# Patient Record
Sex: Male | Born: 1945 | Race: Black or African American | Hispanic: No | Marital: Single | Smoking: Current every day smoker
Health system: Southern US, Community
[De-identification: ages and names within clinical notes are randomized; demographics above are authoritative.]

## PROBLEM LIST (undated history)

## (undated) DIAGNOSIS — S52309A Unspecified fracture of shaft of unspecified radius, initial encounter for closed fracture: Secondary | ICD-10-CM

## (undated) DIAGNOSIS — F17200 Nicotine dependence, unspecified, uncomplicated: Secondary | ICD-10-CM

---

## 2005-06-22 ENCOUNTER — Emergency Department (HOSPITAL_COMMUNITY): Admission: EM | Admit: 2005-06-22 | Discharge: 2005-06-22 | Payer: Self-pay | Admitting: Emergency Medicine

## 2005-07-02 ENCOUNTER — Emergency Department (HOSPITAL_COMMUNITY): Admission: EM | Admit: 2005-07-02 | Discharge: 2005-07-02 | Payer: Self-pay | Admitting: Emergency Medicine

## 2005-09-20 ENCOUNTER — Observation Stay (HOSPITAL_COMMUNITY): Admission: EM | Admit: 2005-09-20 | Discharge: 2005-09-20 | Payer: Self-pay | Admitting: Emergency Medicine

## 2005-11-01 HISTORY — PX: LACERATION REPAIR: SHX5168

## 2013-08-12 ENCOUNTER — Emergency Department (INDEPENDENT_AMBULATORY_CARE_PROVIDER_SITE_OTHER)
Admission: EM | Admit: 2013-08-12 | Discharge: 2013-08-12 | Disposition: A | Discharge status: Home / Self Care | Payer: Medicare Other | Source: Home / Self Care | Attending: Family Medicine | Admitting: Family Medicine

## 2013-08-12 ENCOUNTER — Encounter (HOSPITAL_COMMUNITY): Payer: Self-pay | Admitting: Emergency Medicine

## 2013-08-12 ENCOUNTER — Emergency Department (INDEPENDENT_AMBULATORY_CARE_PROVIDER_SITE_OTHER): Payer: Medicare Other

## 2013-08-12 ENCOUNTER — Emergency Department (HOSPITAL_COMMUNITY): Payer: Medicare Other

## 2013-08-12 DIAGNOSIS — S20212A Contusion of left front wall of thorax, initial encounter: Secondary | ICD-10-CM

## 2013-08-12 DIAGNOSIS — S20219A Contusion of unspecified front wall of thorax, initial encounter: Secondary | ICD-10-CM

## 2013-08-12 DIAGNOSIS — IMO0002 Reserved for concepts with insufficient information to code with codable children: Secondary | ICD-10-CM

## 2013-08-12 MED ORDER — TRAMADOL HCL 50 MG PO TABS: 50.0000 mg | ORAL_TABLET | Freq: Four times a day (QID) | ORAL | Status: DC | PRN
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
## 2015-12-21 ENCOUNTER — Emergency Department (INDEPENDENT_AMBULATORY_CARE_PROVIDER_SITE_OTHER): Payer: Medicare Other

## 2015-12-21 ENCOUNTER — Emergency Department (HOSPITAL_COMMUNITY): Payer: Medicare Other

## 2015-12-21 ENCOUNTER — Encounter (HOSPITAL_COMMUNITY): Payer: Self-pay | Admitting: Emergency Medicine

## 2015-12-21 ENCOUNTER — Emergency Department (INDEPENDENT_AMBULATORY_CARE_PROVIDER_SITE_OTHER)
Admission: EM | Admit: 2015-12-21 | Discharge: 2015-12-21 | Disposition: A | Discharge status: Home / Self Care | Payer: Self-pay | Source: Home / Self Care | Attending: Family Medicine | Admitting: Family Medicine

## 2015-12-21 DIAGNOSIS — R0781 Pleurodynia: Secondary | ICD-10-CM

## 2015-12-21 DIAGNOSIS — R0789 Other chest pain: Secondary | ICD-10-CM

## 2015-12-21 DIAGNOSIS — R51 Headache: Secondary | ICD-10-CM

## 2015-12-21 DIAGNOSIS — M542 Cervicalgia: Secondary | ICD-10-CM

## 2015-12-21 DIAGNOSIS — R519 Headache, unspecified: Secondary | ICD-10-CM

## 2015-12-21 MED ORDER — NAPROXEN 500 MG PO TABS: 500.0000 mg | ORAL_TABLET | Freq: Two times a day (BID) | ORAL | Status: DC

## 2015-12-21 NOTE — Discharge Instructions (Signed)
General Headache Without Cause A headache is pain or discomfort felt around the head or neck area. There are many causes and types of headaches. In some cases, the cause may not be found.  HOME CARE  Managing Pain  Take over-the-counter and prescription medicines only as told by your doctor.  Lie down in a dark, quiet room when you have a headache.  If directed, apply ice to the head and neck area:  Put ice in a plastic bag.  Place a towel between your skin and the bag.  Leave the ice on for 20 minutes, 2-3 times per day.  Use a heating pad or hot shower to apply heat to the head and neck area as told by your doctor.  Keep lights dim if bright lights bother you or make your headaches worse. Eating and Drinking  Eat meals on a regular schedule.  Lessen how much alcohol you drink.  Lessen how much caffeine you drink, or stop drinking caffeine. General Instructions  Keep all follow-up visits as told by your doctor. This is important.  Keep a journal to find out if certain things bring on headaches. For example, write down:  What you eat and drink.  How much sleep you get.  Any change to your diet or medicines.  Relax by getting a massage or doing other relaxing activities.  Lessen stress.  Sit up straight. Do not tighten (tense) your muscles.  Do not use tobacco products. This includes cigarettes, chewing tobacco, or e-cigarettes. If you need help quitting, ask your doctor.  Exercise regularly as told by your doctor.  Get enough sleep. This often means 7-9 hours of sleep. GET HELP IF:  Your symptoms are not helped by medicine.  You have a headache that feels different than the other headaches.  You feel sick to your stomach (nauseous) or you throw up (vomit).  You have a fever. GET HELP RIGHT AWAY IF:   Your headache becomes really bad.  You keep throwing up.  You have a stiff neck.  You have trouble seeing.  You have trouble speaking.  You have  pain in the eye or ear.  Your muscles are weak or you lose muscle control.  You lose your balance or have trouble walking.  You feel like you will pass out (faint) or you pass out.  You have confusion.   This information is not intended to replace advice given to you by your health care provider. Make sure you discuss any questions you have with your health care provider.   Document Released: 07/27/2008 Document Revised: 07/09/2015 Document Reviewed: 02/10/2015 Elsevier Interactive Patient Education 2016 [employer] Chest Wall Pain Chest wall pain is pain in or around the bones and muscles of your chest. Sometimes, an injury causes this pain. Sometimes, the cause may not be known. This pain may take several weeks or longer to get better. HOME CARE Pay attention to any changes in your symptoms. Take these actions to help with your pain:  Rest as told by your doctor.  Avoid activities that cause pain. Try not to use your chest, belly (abdominal), or side muscles to lift heavy things.  If directed, apply ice to the painful area:  Put ice in a plastic bag.  Place a towel between your skin and the bag.  Leave the ice on for 20 minutes, 2-3 times per day.  Take over-the-counter and prescription medicines only as told by your doctor.  Do not use tobacco products, including cigarettes,  chewing tobacco, and e-cigarettes. If you need help quitting, ask your doctor.  Keep all follow-up visits as told by your doctor. This is important. GET HELP IF:  You have a fever.  Your chest pain gets worse.  You have new symptoms. GET HELP RIGHT AWAY IF:  You feel sick to your stomach (nauseous) or you throw up (vomit).  You feel sweaty or light-headed.  You have a cough with phlegm (sputum) or you cough up blood.  You are short of breath.   This information is not intended to replace advice given to you by your health care provider. Make sure you discuss any questions you have with  your health care provider.   Document Released: 04/05/2008 Document Revised: 07/09/2015 Document Reviewed: 01/13/2015 Elsevier Interactive Patient Education 2016 [employer] Cervical Sprain A cervical sprain is an injury in the neck in which the strong, fibrous tissues (ligaments) that connect your neck bones stretch or tear. Cervical sprains can range from mild to severe. Severe cervical sprains can cause the neck vertebrae to be unstable. This can lead to damage of the spinal cord and can result in serious nervous system problems. The amount of time it takes for a cervical sprain to get better depends on the cause and extent of the injury. Most cervical sprains heal in 1 to 3 weeks. CAUSES  Severe cervical sprains may be caused by:   Contact sport injuries (such as from football, rugby, wrestling, hockey, auto racing, gymnastics, diving, martial arts, or boxing).   Motor vehicle collisions.   Whiplash injuries. This is an injury from a sudden forward and backward whipping movement of the head and neck.  Falls.  Mild cervical sprains may be caused by:   Being in an awkward position, such as while cradling a telephone between your ear and shoulder.   Sitting in a chair that does not offer proper support.   Working at a poorly Marketing executive station.   Looking up or down for long periods of time.  SYMPTOMS   Pain, soreness, stiffness, or a burning sensation in the front, back, or sides of the neck. This discomfort may develop immediately after the injury or slowly, 24 hours or more after the injury.   Pain or tenderness directly in the middle of the back of the neck.   Shoulder or upper back pain.   Limited ability to move the neck.   Headache.   Dizziness.   Weakness, numbness, or tingling in the hands or arms.   Muscle spasms.   Difficulty swallowing or chewing.   Tenderness and swelling of the neck.  DIAGNOSIS  Most of the time your health care  provider can diagnose a cervical sprain by taking your history and doing a physical exam. Your health care provider will ask about previous neck injuries and any known neck problems, such as arthritis in the neck. X-rays may be taken to find out if there are any other problems, such as with the bones of the neck. Other tests, such as a CT scan or MRI, may also be needed.  TREATMENT  Treatment depends on the severity of the cervical sprain. Mild sprains can be treated with rest, keeping the neck in place (immobilization), and pain medicines. Severe cervical sprains are immediately immobilized. Further treatment is done to help with pain, muscle spasms, and other symptoms and may include:  Medicines, such as pain relievers, numbing medicines, or muscle relaxants.   Physical therapy. This may involve stretching exercises, strengthening exercises,  and posture training. Exercises and improved posture can help stabilize the neck, strengthen muscles, and help stop symptoms from returning.  HOME CARE INSTRUCTIONS   Put ice on the injured area.   Put ice in a plastic bag.   Place a towel between your skin and the bag.   Leave the ice on for 15-20 minutes, 3-4 times a day.   If your injury was severe, you may have been given a cervical collar to wear. A cervical collar is a two-piece collar designed to keep your neck from moving while it heals.  Do not remove the collar unless instructed by your health care provider.  If you have long hair, keep it outside of the collar.  Ask your health care provider before making any adjustments to your collar. Minor adjustments may be required over time to improve comfort and reduce pressure on your chin or on the back of your head.  Ifyou are allowed to remove the collar for cleaning or bathing, follow your health care provider's instructions on how to do so safely.  Keep your collar clean by wiping it with mild soap and water and drying it completely. If  the collar you have been given includes removable pads, remove them every 1-2 days and hand wash them with soap and water. Allow them to air dry. They should be completely dry before you wear them in the collar.  If you are allowed to remove the collar for cleaning and bathing, wash and dry the skin of your neck. Check your skin for irritation or sores. If you see any, tell your health care provider.  Do not drive while wearing the collar.   Only take over-the-counter or prescription medicines for pain, discomfort, or fever as directed by your health care provider.   Keep all follow-up appointments as directed by your health care provider.   Keep all physical therapy appointments as directed by your health care provider.   Make any needed adjustments to your workstation to promote good posture.   Avoid positions and activities that make your symptoms worse.   Warm up and stretch before being active to help prevent problems.  SEEK MEDICAL CARE IF:   Your pain is not controlled with medicine.   You are unable to decrease your pain medicine over time as planned.   Your activity level is not improving as expected.  SEEK IMMEDIATE MEDICAL CARE IF:   You develop any bleeding.  You develop stomach upset.  You have signs of an allergic reaction to your medicine.   Your symptoms get worse.   You develop new, unexplained symptoms.   You have numbness, tingling, weakness, or paralysis in any part of your body.  MAKE SURE YOU:   Understand these instructions.  Will watch your condition.  Will get help right away if you are not doing well or get worse.   This information is not intended to replace advice given to you by your health care provider. Make sure you discuss any questions you have with your health care provider.   Document Released: 08/15/2007 Document Revised: 10/23/2013 Document Reviewed: 04/25/2013 Elsevier Interactive Patient Education [employer].
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
## 2015-12-21 NOTE — ED Notes (Signed)
D/c by frank patrick, PA

## 2017-03-20 ENCOUNTER — Emergency Department (HOSPITAL_COMMUNITY)
Admission: EM | Admit: 2017-03-20 | Discharge: 2017-03-20 | Disposition: A | Discharge status: Home / Self Care | Payer: No Typology Code available for payment source | Attending: Emergency Medicine | Admitting: Emergency Medicine

## 2017-03-20 ENCOUNTER — Encounter (HOSPITAL_COMMUNITY): Payer: Self-pay | Admitting: *Deleted

## 2017-03-20 ENCOUNTER — Encounter (HOSPITAL_COMMUNITY): Payer: Self-pay | Admitting: Emergency Medicine

## 2017-03-20 ENCOUNTER — Ambulatory Visit (HOSPITAL_COMMUNITY)
Admission: EM | Admit: 2017-03-20 | Discharge: 2017-03-20 | Disposition: A | Discharge status: Home / Self Care | Payer: Medicare Other | Attending: Internal Medicine | Admitting: Internal Medicine

## 2017-03-20 DIAGNOSIS — F17200 Nicotine dependence, unspecified, uncomplicated: Secondary | ICD-10-CM | POA: Diagnosis not present

## 2017-03-20 DIAGNOSIS — M545 Low back pain, unspecified: Secondary | ICD-10-CM

## 2017-03-20 DIAGNOSIS — Y939 Activity, unspecified: Secondary | ICD-10-CM | POA: Insufficient documentation

## 2017-03-20 DIAGNOSIS — R51 Headache: Secondary | ICD-10-CM | POA: Diagnosis not present

## 2017-03-20 DIAGNOSIS — Y999 Unspecified external cause status: Secondary | ICD-10-CM | POA: Insufficient documentation

## 2017-03-20 DIAGNOSIS — Y92410 Unspecified street and highway as the place of occurrence of the external cause: Secondary | ICD-10-CM | POA: Insufficient documentation

## 2017-03-20 DIAGNOSIS — G44309 Post-traumatic headache, unspecified, not intractable: Secondary | ICD-10-CM

## 2017-03-20 DIAGNOSIS — R519 Headache, unspecified: Secondary | ICD-10-CM

## 2017-03-20 DIAGNOSIS — M542 Cervicalgia: Secondary | ICD-10-CM | POA: Diagnosis not present

## 2017-03-20 DIAGNOSIS — M62838 Other muscle spasm: Secondary | ICD-10-CM | POA: Diagnosis not present

## 2017-03-20 DIAGNOSIS — S0990XA Unspecified injury of head, initial encounter: Secondary | ICD-10-CM | POA: Diagnosis present

## 2017-03-20 MED ORDER — CYCLOBENZAPRINE HCL 10 MG PO TABS: 5.0000 mg | ORAL_TABLET | Freq: Two times a day (BID) | ORAL | 0 refills | Status: DC | PRN

## 2017-03-20 MED ORDER — CYCLOBENZAPRINE HCL 10 MG PO TABS
5.0000 mg | ORAL_TABLET | Freq: Once | ORAL | Status: AC
  Administered 2017-03-20: 5 mg via ORAL
  Filled 2017-03-20: qty 1

## 2017-03-20 MED ORDER — IBUPROFEN 800 MG PO TABS
800.0000 mg | ORAL_TABLET | Freq: Once | ORAL | Status: AC
  Administered 2017-03-20: 800 mg via ORAL
  Filled 2017-03-20: qty 1
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
## 2017-03-20 NOTE — ED Provider Notes (Signed)
I have personally seen and examined the patient. I have reviewed the documentation on PMH/FH/Soc Hx. I have discussed the plan of care with the resident and patient.  I have reviewed and agree with the resident's documentation. Please see associated encounter note.   EKG Interpretation None         [name], [name], MD 03/20/17 2221

## 2017-03-20 NOTE — Discharge Instructions (Signed)
For current complaints recommend being transferred to the emergency room for further evaluation and imaging studies.
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
## 2017-03-20 NOTE — ED Notes (Signed)
ED Provider at bedside. 
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
## 2017-03-20 NOTE — ED Notes (Signed)
Pt states he will call a ride to pick him up. Pt ambulatory at DC. NAD. Verbalizes medication teaching and follow up care at [facility] if needed.
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
## 2017-03-24 ENCOUNTER — Ambulatory Visit (HOSPITAL_COMMUNITY)
Admission: EM | Admit: 2017-03-24 | Discharge: 2017-03-24 | Disposition: A | Discharge status: Home / Self Care | Payer: Medicare Other | Attending: Family Medicine | Admitting: Family Medicine

## 2017-03-24 ENCOUNTER — Encounter (HOSPITAL_COMMUNITY): Payer: Self-pay | Admitting: Family Medicine

## 2017-03-24 DIAGNOSIS — M542 Cervicalgia: Secondary | ICD-10-CM

## 2017-03-24 DIAGNOSIS — R079 Chest pain, unspecified: Secondary | ICD-10-CM | POA: Diagnosis not present

## 2017-03-24 MED ORDER — PREDNISONE 20 MG PO TABS: ORAL_TABLET | ORAL | 0 refills | Status: DC
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
## 2017-03-24 NOTE — Discharge Instructions (Signed)
Please return if you're not feeling better by next Monday.
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
## 2017-04-24 ENCOUNTER — Emergency Department (HOSPITAL_COMMUNITY): Payer: Medicare Other

## 2017-04-24 ENCOUNTER — Encounter (HOSPITAL_COMMUNITY): Payer: Self-pay

## 2017-04-24 ENCOUNTER — Emergency Department (HOSPITAL_COMMUNITY)
Admission: EM | Admit: 2017-04-24 | Discharge: 2017-04-24 | Disposition: A | Discharge status: Home / Self Care | Payer: Medicare Other | Attending: Emergency Medicine | Admitting: Emergency Medicine

## 2017-04-24 DIAGNOSIS — Y939 Activity, unspecified: Secondary | ICD-10-CM | POA: Insufficient documentation

## 2017-04-24 DIAGNOSIS — W010XXA Fall on same level from slipping, tripping and stumbling without subsequent striking against object, initial encounter: Secondary | ICD-10-CM | POA: Diagnosis not present

## 2017-04-24 DIAGNOSIS — S52322A Displaced transverse fracture of shaft of left radius, initial encounter for closed fracture: Secondary | ICD-10-CM | POA: Insufficient documentation

## 2017-04-24 DIAGNOSIS — Y929 Unspecified place or not applicable: Secondary | ICD-10-CM | POA: Insufficient documentation

## 2017-04-24 DIAGNOSIS — Y998 Other external cause status: Secondary | ICD-10-CM | POA: Diagnosis not present

## 2017-04-24 DIAGNOSIS — S59912A Unspecified injury of left forearm, initial encounter: Secondary | ICD-10-CM | POA: Diagnosis present

## 2017-04-24 DIAGNOSIS — S52322B Displaced transverse fracture of shaft of left radius, initial encounter for open fracture type I or II: Secondary | ICD-10-CM | POA: Insufficient documentation

## 2017-04-24 MED ORDER — FENTANYL CITRATE (PF) 100 MCG/2ML IJ SOLN
50.0000 ug | Freq: Once | INTRAMUSCULAR | Status: AC
  Administered 2017-04-24: 50 ug via INTRAVENOUS
  Filled 2017-04-24: qty 2

## 2017-04-24 MED ORDER — IBUPROFEN 400 MG PO TABS: 400.0000 mg | ORAL_TABLET | Freq: Four times a day (QID) | ORAL | 0 refills | Status: DC | PRN

## 2017-04-24 MED ORDER — ACETAMINOPHEN 500 MG PO TABS: 1000.0000 mg | ORAL_TABLET | Freq: Four times a day (QID) | ORAL | 0 refills | Status: DC | PRN

## 2017-04-24 MED ORDER — CEPHALEXIN 500 MG PO CAPS: 500.0000 mg | ORAL_CAPSULE | Freq: Three times a day (TID) | ORAL | 0 refills | Status: AC

## 2017-04-24 MED ORDER — CEFAZOLIN SODIUM-DEXTROSE 2-4 GM/100ML-% IV SOLN
2.0000 g | Freq: Once | INTRAVENOUS | Status: AC
  Administered 2017-04-24: 2 g via INTRAVENOUS
  Filled 2017-04-24: qty 100

## 2017-04-24 MED ORDER — OXYCODONE HCL 5 MG PO TABS: 5.0000 mg | ORAL_TABLET | ORAL | 0 refills | Status: AC | PRN

## 2017-04-24 MED ORDER — TETANUS-DIPHTH-ACELL PERTUSSIS 5-2.5-18.5 LF-MCG/0.5 IM SUSP
0.5000 mL | Freq: Once | INTRAMUSCULAR | Status: AC
  Administered 2017-04-24: 0.5 mL via INTRAMUSCULAR
  Filled 2017-04-24: qty 0.5
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
## 2017-04-24 NOTE — Progress Notes (Signed)
Orthopedic Tech Progress Note Patient Details:  Garrett ShihWilliam Patterson September 13, 1946 [id number]  Ortho Devices Type of Ortho Device: Ace wrap, Arm sling, Sugartong splint Ortho Device/Splint Location: lue Ortho Device/Splint Interventions: Application   Brandice Busser 04/24/2017, 1:58 PM

## 2017-04-24 NOTE — ED Notes (Addendum)
Garrett Standardamela Stempson- Daughter- (h) [phone] (c) [phone] Call her when pt is discharged

## 2017-04-24 NOTE — ED Notes (Signed)
ED Provider at bedside. 

## 2017-04-24 NOTE — Consult Note (Signed)
Reason for Consult: Left radius shaft fracture Referring Physician: Dalene SeltzerSchlossman MD (EDP)  Garrett Patterson is an 71 y.o. male.  HPI:   71 yo male who reports a mechanical fall at his home last evening landing on his outstretched left arm.  He does admit drinking "a little".  After continued left forearm pain today and some bleeding, he walked to get food and groceries, ate, then came to the ED for further evaluation and treatment.  He was noted to have a left arm deformity with pain and a small area of dried blood on the dorsal aspect that could represent a punctate opening.  He reports left arm pain and denies other injuries.  He denies numbness in his left hand.  He has received ancef and tetanus.  History reviewed. No pertinent past medical history.  History reviewed. No pertinent surgical history.  No family history on file.  Social History:  reports that he has been smoking.  He has never used smokeless tobacco. He reports that he drinks alcohol. He reports that he does not use drugs.  Allergies: No Known Allergies  Medications: I have reviewed the patient's current medications.  No results found for this or any previous visit (from the past 48 hour(s)).  Dg Forearm Left  Result Date: 04/24/2017 CLINICAL DATA:  Fall down stairs last night; Painful left forearm with obvious deformity EXAM: LEFT FOREARM - 2 VIEW COMPARISON:  None. FINDINGS: Horizontal fracture through the mid shaft radius with 1 shaft width ulnar displacement of the distal fracture fragment and 1 cm override. Elbow joint and radiocarpal joint are intact. IMPRESSION: Horizontal fracture through the midshaft radius with displacement and override. Electronically Signed   By: Genevive BiStewart  Edmunds M.D.   On: 04/24/2017 12:25    ROS Blood pressure (!) 151/73, pulse 74, temperature 98.5 F (36.9 C), resp. rate 12, SpO2 100 %. Physical Exam  Constitutional: He is oriented to person, place, and time. He appears well-developed and  well-nourished.  HENT:  Head: Normocephalic and atraumatic.  Eyes: Pupils are equal, round, and reactive to light.  Neck: Normal range of motion.  Cardiovascular: Normal rate.   Respiratory: Effort normal.  GI: Soft.  Musculoskeletal:       Left forearm: He exhibits tenderness, bony tenderness, swelling and deformity.       Arms: Neurological: He is alert and oriented to person, place, and time.  Psychiatric: He has a normal mood and affect.   He is able to move his left fingers and thumb and his hand is well perfused. His left forearm compartments are soft. His hand shows normal sensation  X-rays of his left forearm independently reviewed show a left radial shaft fracture.  The ulna appears intact and the elbow joint and the DRUJ do not show disruption.   Assessment/Plan: Left radius shaft fracture  1)  Since he has eaten and this is non-emergent, I will have him placed in a splint and will plan for surgery this week.  I probed the dorsal wound and it is minimal.  Will send him home on oral Keflex and pain meds.  I have his contact information for setting him up for surgery as an outpatient.  All questions were encouraged and anwsered.  Garrett Patterson 04/24/2017, 1:44 PM

## 2017-04-24 NOTE — Progress Notes (Signed)
Orthopedic Tech Progress Note Patient Details:  Garrett Patterson June 02, 1946 [id number]  Patient ID: Garrett Patterson, male   DOB: June 02, 1946, 71 y.o.   MRN: [id number]   Nikki DomCrawford, Shmuel Girgis 04/24/2017, 1:59 PM As ordered by Dr. Magnus IvanBlackman

## 2017-04-24 NOTE — ED Triage Notes (Addendum)
Patient fell last night trying to catch himself with left arm. Now swelling with small puncture noted to same.Marland Kitchen. Positive distal pulses, possible deformity to forearm.

## 2017-04-24 NOTE — ED Notes (Signed)
Dr. Blackman at bedside. 

## 2017-04-25 ENCOUNTER — Encounter (HOSPITAL_BASED_OUTPATIENT_CLINIC_OR_DEPARTMENT_OTHER): Payer: Self-pay | Admitting: *Deleted

## 2017-04-25 ENCOUNTER — Other Ambulatory Visit (INDEPENDENT_AMBULATORY_CARE_PROVIDER_SITE_OTHER): Payer: Self-pay | Admitting: Orthopaedic Surgery

## 2017-04-25 DIAGNOSIS — S52302A Unspecified fracture of shaft of left radius, initial encounter for closed fracture: Secondary | ICD-10-CM

## 2017-04-27 ENCOUNTER — Ambulatory Visit (HOSPITAL_BASED_OUTPATIENT_CLINIC_OR_DEPARTMENT_OTHER): Payer: Medicare Other | Admitting: Certified Registered"

## 2017-04-27 ENCOUNTER — Encounter (HOSPITAL_BASED_OUTPATIENT_CLINIC_OR_DEPARTMENT_OTHER): Payer: Self-pay

## 2017-04-27 ENCOUNTER — Ambulatory Visit (HOSPITAL_BASED_OUTPATIENT_CLINIC_OR_DEPARTMENT_OTHER)
Admission: RE | Admit: 2017-04-27 | Discharge: 2017-04-27 | Disposition: A | Discharge status: Home / Self Care | Payer: Medicare Other | Source: Ambulatory Visit | Attending: Orthopaedic Surgery | Admitting: Orthopaedic Surgery

## 2017-04-27 ENCOUNTER — Encounter (HOSPITAL_BASED_OUTPATIENT_CLINIC_OR_DEPARTMENT_OTHER)
Admission: RE | Disposition: A | Discharge status: Home / Self Care | Payer: Self-pay | Source: Ambulatory Visit | Attending: Orthopaedic Surgery

## 2017-04-27 DIAGNOSIS — S52302A Unspecified fracture of shaft of left radius, initial encounter for closed fracture: Secondary | ICD-10-CM | POA: Diagnosis present

## 2017-04-27 DIAGNOSIS — S52322A Displaced transverse fracture of shaft of left radius, initial encounter for closed fracture: Secondary | ICD-10-CM

## 2017-04-27 DIAGNOSIS — Z9889 Other specified postprocedural states: Secondary | ICD-10-CM | POA: Diagnosis not present

## 2017-04-27 DIAGNOSIS — F17200 Nicotine dependence, unspecified, uncomplicated: Secondary | ICD-10-CM | POA: Insufficient documentation

## 2017-04-27 DIAGNOSIS — X58XXXA Exposure to other specified factors, initial encounter: Secondary | ICD-10-CM | POA: Diagnosis not present

## 2017-04-27 HISTORY — PX: OPEN REDUCTION INTERNAL FIXATION (ORIF) DISTAL RADIAL FRACTURE: SHX5989

## 2017-04-27 HISTORY — DX: Unspecified fracture of shaft of unspecified radius, initial encounter for closed fracture: S52.309A

## 2017-04-27 HISTORY — DX: Nicotine dependence, unspecified, uncomplicated: F17.200

## 2017-04-27 SURGERY — OPEN REDUCTION INTERNAL FIXATION (ORIF) DISTAL RADIUS FRACTURE
Anesthesia: General | Site: Arm Lower | Laterality: Left

## 2017-04-27 MED ORDER — SCOPOLAMINE 1 MG/3DAYS TD PT72: 1.0000 | MEDICATED_PATCH | Freq: Once | TRANSDERMAL | Status: DC | PRN

## 2017-04-27 MED ORDER — SENNOSIDES-DOCUSATE SODIUM 8.6-50 MG PO TABS: 1.0000 | ORAL_TABLET | Freq: Every evening | ORAL | 1 refills | Status: AC | PRN

## 2017-04-27 MED ORDER — PROPOFOL 10 MG/ML IV BOLUS
INTRAVENOUS | Status: DC | PRN
  Administered 2017-04-27: 100 mg via INTRAVENOUS

## 2017-04-27 MED ORDER — PROMETHAZINE HCL 25 MG PO TABS: 25.0000 mg | ORAL_TABLET | Freq: Four times a day (QID) | ORAL | 1 refills | Status: AC | PRN

## 2017-04-27 MED ORDER — EPHEDRINE SULFATE 50 MG/ML IJ SOLN
INTRAMUSCULAR | Status: DC | PRN
  Administered 2017-04-27 (×2): 10 mg via INTRAVENOUS

## 2017-04-27 MED ORDER — LIDOCAINE HCL (CARDIAC) 20 MG/ML IV SOLN
INTRAVENOUS | Status: DC | PRN
  Administered 2017-04-27: 30 mg via INTRAVENOUS

## 2017-04-27 MED ORDER — MIDAZOLAM HCL 2 MG/2ML IJ SOLN
INTRAMUSCULAR | Status: AC
  Filled 2017-04-27: qty 2

## 2017-04-27 MED ORDER — LACTATED RINGERS IV SOLN
INTRAVENOUS | Status: DC
  Administered 2017-04-27 (×2): via INTRAVENOUS

## 2017-04-27 MED ORDER — METHOCARBAMOL 500 MG PO TABS: 500.0000 mg | ORAL_TABLET | Freq: Four times a day (QID) | ORAL | 2 refills | Status: AC | PRN

## 2017-04-27 MED ORDER — CEFAZOLIN SODIUM-DEXTROSE 2-4 GM/100ML-% IV SOLN
INTRAVENOUS | Status: AC
  Filled 2017-04-27: qty 100

## 2017-04-27 MED ORDER — DEXAMETHASONE SODIUM PHOSPHATE 10 MG/ML IJ SOLN
INTRAMUSCULAR | Status: DC | PRN
  Administered 2017-04-27: 10 mg via INTRAVENOUS

## 2017-04-27 MED ORDER — FENTANYL CITRATE (PF) 100 MCG/2ML IJ SOLN
INTRAMUSCULAR | Status: AC
  Filled 2017-04-27: qty 2

## 2017-04-27 MED ORDER — OXYCODONE HCL ER 10 MG PO T12A: 10.0000 mg | EXTENDED_RELEASE_TABLET | Freq: Two times a day (BID) | ORAL | 0 refills | Status: AC

## 2017-04-27 MED ORDER — ONDANSETRON HCL 4 MG/2ML IJ SOLN: 4.0000 mg | Freq: Once | INTRAMUSCULAR | Status: DC | PRN

## 2017-04-27 MED ORDER — CEFAZOLIN SODIUM-DEXTROSE 2-4 GM/100ML-% IV SOLN
2.0000 g | INTRAVENOUS | Status: AC
  Administered 2017-04-27: 2 g via INTRAVENOUS

## 2017-04-27 MED ORDER — ONDANSETRON HCL 4 MG PO TABS: 4.0000 mg | ORAL_TABLET | Freq: Three times a day (TID) | ORAL | 0 refills | Status: AC | PRN

## 2017-04-27 MED ORDER — OXYCODONE HCL 5 MG PO TABS: 5.0000 mg | ORAL_TABLET | ORAL | 0 refills | Status: AC | PRN

## 2017-04-27 MED ORDER — MIDAZOLAM HCL 2 MG/2ML IJ SOLN
1.0000 mg | INTRAMUSCULAR | Status: DC | PRN
  Administered 2017-04-27: 1 mg via INTRAVENOUS

## 2017-04-27 MED ORDER — BUPIVACAINE-EPINEPHRINE (PF) 0.5% -1:200000 IJ SOLN
INTRAMUSCULAR | Status: DC | PRN
  Administered 2017-04-27: 20 mL via PERINEURAL

## 2017-04-27 MED ORDER — FENTANYL CITRATE (PF) 100 MCG/2ML IJ SOLN
50.0000 ug | INTRAMUSCULAR | Status: DC | PRN
  Administered 2017-04-27: 50 ug via INTRAVENOUS

## 2017-04-27 MED ORDER — FENTANYL CITRATE (PF) 100 MCG/2ML IJ SOLN: 25.0000 ug | INTRAMUSCULAR | Status: DC | PRN

## 2017-04-27 SURGICAL SUPPLY — 68 items
BANDAGE ACE 3X5.8 VEL STRL LF (GAUZE/BANDAGES/DRESSINGS) ×3 IMPLANT
BANDAGE ACE 4X5 VEL STRL LF (GAUZE/BANDAGES/DRESSINGS) ×2 IMPLANT
BIT DRILL 2.6 (BIT) ×2 IMPLANT
BLADE MINI RND TIP GREEN BEAV (BLADE) IMPLANT
BLADE SURG 15 STRL LF DISP TIS (BLADE) ×2 IMPLANT
BLADE SURG 15 STRL SS (BLADE) ×6
BNDG CMPR 9X4 STRL LF SNTH (GAUZE/BANDAGES/DRESSINGS) ×1
BNDG ESMARK 4X9 LF (GAUZE/BANDAGES/DRESSINGS) ×3 IMPLANT
BRUSH SCRUB EZ PLAIN DRY (MISCELLANEOUS) ×3 IMPLANT
CORDS BIPOLAR (ELECTRODE) ×3 IMPLANT
COVER BACK TABLE 60X90IN (DRAPES) ×3 IMPLANT
COVER MAYO STAND STRL (DRAPES) ×3 IMPLANT
CUFF TOURNIQUET SINGLE 18IN (TOURNIQUET CUFF) IMPLANT
DECANTER SPIKE VIAL GLASS SM (MISCELLANEOUS) IMPLANT
DRAPE EXTREMITY T 121X128X90 (DRAPE) ×3 IMPLANT
DRAPE OEC MINIVIEW 54X84 (DRAPES) ×3 IMPLANT
DRAPE SURG 17X23 STRL (DRAPES) ×3 IMPLANT
DRILL 2.6X220MM LONG AO (BIT) ×2 IMPLANT
GAUZE SPONGE 4X4 12PLY STRL (GAUZE/BANDAGES/DRESSINGS) ×3 IMPLANT
GAUZE SPONGE 4X4 16PLY XRAY LF (GAUZE/BANDAGES/DRESSINGS) IMPLANT
GAUZE XEROFORM 1X8 LF (GAUZE/BANDAGES/DRESSINGS) ×3 IMPLANT
GLOVE BIO SURGEON STRL SZ 6.5 (GLOVE) ×1 IMPLANT
GLOVE BIO SURGEONS STRL SZ 6.5 (GLOVE) ×1
GLOVE BIOGEL PI IND STRL 7.0 (GLOVE) IMPLANT
GLOVE BIOGEL PI INDICATOR 7.0 (GLOVE) ×4
GLOVE SKINSENSE NS SZ7.5 (GLOVE) ×2
GLOVE SKINSENSE STRL SZ7.5 (GLOVE) ×1 IMPLANT
GLOVE SURG SYN 7.5  E (GLOVE) ×2
GLOVE SURG SYN 7.5 E (GLOVE) ×1 IMPLANT
GLOVE SURG SYN 7.5 PF PI (GLOVE) ×1 IMPLANT
GOWN STRL REIN XL XLG (GOWN DISPOSABLE) ×3 IMPLANT
GOWN STRL REUS W/ TWL LRG LVL3 (GOWN DISPOSABLE) ×1 IMPLANT
GOWN STRL REUS W/TWL LRG LVL3 (GOWN DISPOSABLE) ×3
NEEDLE HYPO 22GX1.5 SAFETY (NEEDLE) IMPLANT
NS IRRIG 1000ML POUR BTL (IV SOLUTION) ×3 IMPLANT
PACK BASIN DAY SURGERY FS (CUSTOM PROCEDURE TRAY) ×3 IMPLANT
PAD CAST 3X4 CTTN HI CHSV (CAST SUPPLIES) ×1 IMPLANT
PADDING CAST ABS 3INX4YD NS (CAST SUPPLIES)
PADDING CAST ABS COTTON 3X4 (CAST SUPPLIES) IMPLANT
PADDING CAST COTTON 3X4 STRL (CAST SUPPLIES) ×3
PADDING CAST SYNTHETIC 3 NS LF (CAST SUPPLIES) ×2
PADDING CAST SYNTHETIC 3X4 NS (CAST SUPPLIES) IMPLANT
PADDING CAST SYNTHETIC 4 (CAST SUPPLIES) ×2
PADDING CAST SYNTHETIC 4X4 STR (CAST SUPPLIES) IMPLANT
PLATE COMPRESSION 7H STR (Plate) ×2 IMPLANT
RUBBERBAND STERILE (MISCELLANEOUS) ×3 IMPLANT
SCREW BONE 3.5X14MM (Screw) ×2 IMPLANT
SCREW BONE 3.5X16MM (Screw) ×10 IMPLANT
SLEEVE SCD COMPRESS KNEE MED (MISCELLANEOUS) ×3 IMPLANT
SPLINT FIBERGLASS 3X35 (CAST SUPPLIES) IMPLANT
SPONGE LAP 18X18 X RAY DECT (DISPOSABLE) ×3 IMPLANT
STOCKINETTE 4X48 STRL (DRAPES) ×3 IMPLANT
SUCTION FRAZIER HANDLE 10FR (MISCELLANEOUS) ×2
SUCTION TUBE FRAZIER 10FR DISP (MISCELLANEOUS) ×1 IMPLANT
SUT ETHILON 4 0 PS 2 18 (SUTURE) ×3 IMPLANT
SUT VIC AB 2-0 SH 27 (SUTURE)
SUT VIC AB 2-0 SH 27XBRD (SUTURE) IMPLANT
SUT VIC AB 3-0 PS1 18 (SUTURE) ×3
SUT VIC AB 3-0 PS1 18XBRD (SUTURE) ×1 IMPLANT
SYR BULB 3OZ (MISCELLANEOUS) ×3 IMPLANT
SYR CONTROL 10ML LL (SYRINGE) IMPLANT
TOWEL OR 17X24 6PK STRL BLUE (TOWEL DISPOSABLE) ×6 IMPLANT
TOWEL OR NON WOVEN STRL DISP B (DISPOSABLE) ×3 IMPLANT
TRAY DSU PREP LF (CUSTOM PROCEDURE TRAY) ×3 IMPLANT
TUBE CONNECTING 20'X1/4 (TUBING) ×1
TUBE CONNECTING 20X1/4 (TUBING) ×2 IMPLANT
UNDERPAD 30X30 (UNDERPADS AND DIAPERS) ×3 IMPLANT
YANKAUER SUCT BULB TIP NO VENT (SUCTIONS) IMPLANT

## 2017-04-27 NOTE — Progress Notes (Signed)
Assisted Dr. Turk with left, ultrasound guided, supraclavicular block. Side rails up, monitors on throughout procedure. See vital signs in flow sheet. Tolerated Procedure well. 

## 2017-04-27 NOTE — Anesthesia Preprocedure Evaluation (Addendum)
Anesthesia Evaluation  Patient identified by MRN, date of birth, ID band Patient awake    Reviewed: Allergy & Precautions, NPO status , Patient's Chart, lab work & pertinent test results  Airway Mallampati: II  TM Distance: >3 FB Neck ROM: Full    Dental  (+) Dental Advisory Given, Lower Dentures, Upper Dentures   Pulmonary Current Smoker,    Pulmonary exam normal breath sounds clear to auscultation       Cardiovascular Exercise Tolerance: Good negative cardio ROS Normal cardiovascular exam Rhythm:Regular Rate:Normal     Neuro/Psych negative neurological ROS  negative psych ROS   GI/Hepatic negative GI ROS, Neg liver ROS, (+)     substance abuse  cocaine use,   Endo/Other  negative endocrine ROS  Renal/GU negative Renal ROS     Musculoskeletal Left radial fracture    Abdominal   Peds  Hematology negative hematology ROS (+)   Anesthesia Other Findings Day of surgery medications reviewed with the patient.  Reproductive/Obstetrics                            Anesthesia Physical Anesthesia Plan  ASA: II  Anesthesia Plan: General   Post-op Pain Management:  Regional for Post-op pain   Induction: Intravenous  PONV Risk Score and Plan: 1 and Ondansetron and Dexamethasone  Airway Management Planned: LMA  Additional Equipment:   Intra-op Plan:   Post-operative Plan: Extubation in OR  Informed Consent: I have reviewed the patients History and Physical, chart, labs and discussed the procedure including the risks, benefits and alternatives for the proposed anesthesia with the patient or authorized representative who has indicated his/her understanding and acceptance.   Dental advisory given  Plan Discussed with: CRNA  Anesthesia Plan Comments: (Risks/benefits of general anesthesia discussed with patient including risk of damage to teeth, lips, gum, and tongue, nausea/vomiting,  allergic reactions to medications, and the possibility of heart attack, stroke and death.  All patient questions answered.  Patient wishes to proceed.)        Anesthesia Quick Evaluation

## 2017-04-27 NOTE — Discharge Instructions (Signed)
Postoperative instructions:  Weightbearing instructions: non weight bearing.  May move elbow, shoulder, wrist and fingers as tolerated  Dressing instructions: Keep your dressing and/or splint clean and dry at all times.  It will be removed at your first post-operative appointment.  Your stitches and/or staples will be removed at this visit.  Incision instructions:  Do not soak your incision for 3 weeks after surgery.  If the incision gets wet, pat dry and do not scrub the incision.  Pain control:  You have been given a prescription to be taken as directed for post-operative pain control.  In addition, elevate the operative extremity above the heart at all times to prevent swelling and throbbing pain.  Take over-the-counter Colace, 100mg  by mouth twice a day while taking narcotic pain medications to help prevent constipation.  Follow up appointments: 1) 10-14 days for suture removal and wound check. 2) Dr. Roda Shutters as scheduled.   -------------------------------------------------------------------------------------------------------------  After Surgery Pain Control:  After your surgery, post-surgical discomfort or pain is likely. This discomfort can last several days to a few weeks. At certain times of the day your discomfort may be more intense.  Did you receive a nerve block?  A nerve block can provide pain relief for one hour to two days after your surgery. As long as the nerve block is working, you will experience little or no sensation in the area the surgeon operated on.  As the nerve block wears off, you will begin to experience pain or discomfort. It is very important that you begin taking your prescribed pain medication before the nerve block fully wears off. Treating your pain at the first sign of the block wearing off will ensure your pain is better controlled and more tolerable when full-sensation returns. Do not wait until the pain is intolerable, as the medicine will be less effective.  It is better to treat pain in advance than to try and catch up.  General Anesthesia:  If you did not receive a nerve block during your surgery, you will need to start taking your pain medication shortly after your surgery and should continue to do so as prescribed by your surgeon.  Pain Medication:  Most commonly we prescribe Vicodin and Percocet for post-operative pain. Both of these medications contain a combination of acetaminophen (Tylenol) and a narcotic to help control pain.   It takes between 30 and 45 minutes before pain medication starts to work. It is important to take your medication before your pain level gets too intense.   Nausea is a common side effect of many pain medications. You will want to eat something before taking your pain medicine to help prevent nausea.   If you are taking a prescription pain medication that contains acetaminophen, we recommend that you do not take additional over the counter acetaminophen (Tylenol).  Other pain relieving options:   Using a cold pack to ice the affected area a few times a day (15 to 20 minutes at a time) can help to relieve pain, reduce swelling and bruising.   Elevation of the affected area can also help to reduce pain and swelling.     Post Anesthesia Home Care Instructions  Activity: Get plenty of rest for the remainder of the day. A responsible individual must stay with you for 24 hours following the procedure.  For the next 24 hours, DO NOT: -Drive a car -Advertising copywriter -Drink alcoholic beverages -Take any medication unless instructed by your physician -Make any legal decisions or sign important  papers.  Meals: Start with liquid foods such as gelatin or soup. Progress to regular foods as tolerated. Avoid greasy, spicy, heavy foods. If nausea and/or vomiting occur, drink only clear liquids until the nausea and/or vomiting subsides. Call your physician if vomiting continues.  Special Instructions/Symptoms: Your  throat may feel dry or sore from the anesthesia or the breathing tube placed in your throat during surgery. If this causes discomfort, gargle with warm salt water. The discomfort should disappear within 24 hours.  If you had a scopolamine patch placed behind your ear for the management of post- operative nausea and/or vomiting:  1. The medication in the patch is effective for 72 hours, after which it should be removed.  Wrap patch in a tissue and discard in the trash. Wash hands thoroughly with soap and water. 2. You may remove the patch earlier than 72 hours if you experience unpleasant side effects which may include dry mouth, dizziness or visual disturbances. 3. Avoid touching the patch. Wash your hands with soap and water after contact with the patch.   Regional Anesthesia Blocks  1. Numbness or the inability to move the "blocked" extremity may last from 3-48 hours after placement. The length of time depends on the medication injected and your individual response to the medication. If the numbness is not going away after 48 hours, call your surgeon.  2. The extremity that is blocked will need to be protected until the numbness is gone and the  Strength has returned. Because you cannot feel it, you will need to take extra care to avoid injury. Because it may be weak, you may have difficulty moving it or using it. You may not know what position it is in without looking at it while the block is in effect.  3. For blocks in the legs and feet, returning to weight bearing and walking needs to be done carefully. You will need to wait until the numbness is entirely gone and the strength has returned. You should be able to move your leg and foot normally before you try and bear weight or walk. You will need someone to be with you when you first try to ensure you do not fall and possibly risk injury.  4. Bruising and tenderness at the needle site are common side effects and will resolve in a few  days.  5. Persistent numbness or new problems with movement should be communicated to the surgeon or the [facility] [place] [phone])/ [facility] [phone]).

## 2017-04-27 NOTE — Anesthesia Procedure Notes (Signed)
Anesthesia Regional Block: Supraclavicular block   Pre-Anesthetic Checklist: ,, timeout performed, Correct Patient, Correct Site, Correct Laterality, Correct Procedure, Correct Position, site marked, Risks and benefits discussed,  Surgical consent,  Pre-op evaluation,  At surgeon's request and post-op pain management  Laterality: Left  Prep: chloraprep       Needles:  Injection technique: Single-shot  Needle Type: Echogenic Needle     Needle Length: 9cm  Needle Gauge: 21     Additional Needles:   Procedures: ultrasound guided,,,,,,,,  Narrative:  Start time: 04/27/2017 9:48 AM End time: 04/27/2017 9:53 AM Injection made incrementally with aspirations every 5 mL.  Performed by: Personally  Anesthesiologist: Cecile HearingURK, Xzayvier Fagin EDWARD  Additional Notes: No pain on injection. No increased resistance to injection. Injection made in 5cc increments.  Good needle visualization.  Patient tolerated procedure well.

## 2017-04-27 NOTE — Op Note (Signed)
   Date of Surgery: 04/27/2017  INDICATIONS: Mr. Garrett Patterson is a 71 y.o.-year-old male who sustained a left radial shaft fracture; he was indicated for open reduction and internal fixation due to the displaced nature of the fracture and came to the operating room today for this procedure. The patient did consent to the procedure after discussion of the risks and benefits.   PREOPERATIVE DIAGNOSIS: left forearm fracture (radius only).  POSTOPERATIVE DIAGNOSIS: Same.  PROCEDURE: Open reduction and internal fixation left radial shaft  CPT:  25515 radius  SURGEON: N. Glee ArvinMichael Jaydien Panepinto, M.D.  ASSIST: none M.D.  ANESTHESIA: general  IV FLUIDS AND URINE: See anesthesia.  ESTIMATED BLOOD LOSS: minimal mL.  IMPLANTS: Stryker small fragment plate and 3.5 mm cortical screws  DRAINS: None.  COMPLICATIONS: None.  DESCRIPTION OF PROCEDURE: The patient was brought to the operating room and placed supine on the operating table.  The patient had been signed prior to the procedure. The patient had the anesthesia placed by the anesthesiologist.  The prep verification and incision time-outs were performed to confirm that this was the correct patient, site, side and location. The patient had SCDs in place on the lower extremities. The patient did receive antibiotics prior to the incision and was re-dosed during the procedure as needed at indicated intervals.  A well padded tourniquet was placed on the upper arm.  The upper extremity was prepped and draped in the standard fashion.  The bony landmarks were palpated and the incisions were drawn on the skin.  The tourniquet was inflated to 250 mmHg. A volar approach of [name] was used to expose the radius. The interval between the FCR and radial artery was dissected down to the pronator quadratus distally. This was carried proximally and the FPL, pronator teres insertion were elevated off of the radial side of the radius gently, again protecting the neurovascular  structures and radial artery. The radial shaft fracture was first visualized and debrided of dead muscle. This was able to be reduced directly under direct visualization and the plate was placed in compression mode. The screws were all placed in a standard fashion: first drilling, then measuring with a depth gauge, then placing the screw on power and tightening by hand.   The final x-rays were taken in both AP and lateral views to confirm the reduction and screw lengths and locations. The wounds were copiously irrigated with normal saline.  The wound closed in a layer fashion using 2.0 vicryl suture and the skin was re-approximated with 3-0 nylon horizontal mattress sutures. The wounds were cleaned and dried a final time and a sterile dressing was placed. The patient was then placed in a short arm splint. The patient was then transferred back to the bed and left the operating room in stable condition.  All sponge and instrument counts were correct.  POSTOPERATIVE PLAN: Mr. Garrett Patterson will remain non-weight bearing on this arm for approximately three months; he will return for suture removal in 2 weeks. Elbow range of motion, including supination and pronation, should start immediately.  Mr. Garrett Patterson will receive DVT prophylaxis based on other medications, activity level, and risk ratio of bleeding to thrombosis.
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
## 2017-04-27 NOTE — Transfer of Care (Signed)
Immediate Anesthesia Transfer of Care Note  Patient: Garrett Patterson  Procedure(s) Performed: Procedure(s): OPEN REDUCTION INTERNAL FIXATION (ORIF) LEFT RADIAL  SHAFT FRACTURE (Left)  Patient Location: PACU  Anesthesia Type:GA combined with regional for post-op pain  Level of Consciousness: awake, sedated and patient cooperative  Airway & Oxygen Therapy: Patient Spontanous Breathing and Patient connected to face mask oxygen  Post-op Assessment: Report given to RN and Post -op Vital signs reviewed and stable  Post vital signs: Reviewed and stable  Last Vitals:  Vitals:   04/27/17 0957 04/27/17 1000  BP:    Pulse: 71 70  Resp: 15 12  Temp:      Last Pain:  Vitals:   04/27/17 0952  TempSrc:   PainSc: 3       Patients Stated Pain Goal: 0 (04/27/17 0952)  Complications: No apparent anesthesia complications

## 2017-04-27 NOTE — Anesthesia Procedure Notes (Signed)
Procedure Name: LMA Insertion Date/Time: 04/27/2017 10:12 AM Performed by: Wrenly Lauritsen D Pre-anesthesia Checklist: Patient identified, Emergency Drugs available, Suction available and Patient being monitored Patient Re-evaluated:Patient Re-evaluated prior to inductionOxygen Delivery Method: Circle system utilized Preoxygenation: Pre-oxygenation with 100% oxygen Intubation Type: IV induction Ventilation: Mask ventilation without difficulty LMA: LMA inserted LMA Size: 4.0 Number of attempts: 1 Airway Equipment and Method: Bite block Placement Confirmation: positive ETCO2 Tube secured with: Tape Dental Injury: Teeth and Oropharynx as per pre-operative assessment

## 2017-04-27 NOTE — Anesthesia Postprocedure Evaluation (Signed)
Anesthesia Post Note  Patient: Garrett Patterson  Procedure(s) Performed: Procedure(s) (LRB): OPEN REDUCTION INTERNAL FIXATION (ORIF) LEFT RADIAL  SHAFT FRACTURE (Left)     Patient location during evaluation: PACU Anesthesia Type: General Level of consciousness: awake and alert Pain management: pain level controlled Vital Signs Assessment: post-procedure vital signs reviewed and stable Respiratory status: nonlabored ventilation, respiratory function stable and spontaneous breathing Cardiovascular status: blood pressure returned to baseline and stable Postop Assessment: no signs of nausea or vomiting Anesthetic complications: no    Last Vitals:  Vitals:   04/27/17 1200 04/27/17 1230  BP: 137/73 136/84  Pulse: 73 77  Resp: 17 18  Temp:  36.4 C    Last Pain:  Vitals:   04/27/17 1230  TempSrc:   PainSc: 0-No pain                 Cecile HearingStephen Edward Turk

## 2017-04-29 ENCOUNTER — Encounter (HOSPITAL_BASED_OUTPATIENT_CLINIC_OR_DEPARTMENT_OTHER): Payer: Self-pay | Admitting: Orthopaedic Surgery

## 2017-05-12 ENCOUNTER — Inpatient Hospital Stay (INDEPENDENT_AMBULATORY_CARE_PROVIDER_SITE_OTHER): Payer: Medicare Other | Admitting: Orthopaedic Surgery

## 2017-05-16 ENCOUNTER — Ambulatory Visit (INDEPENDENT_AMBULATORY_CARE_PROVIDER_SITE_OTHER): Payer: Medicare Other | Admitting: Orthopaedic Surgery

## 2017-05-16 ENCOUNTER — Ambulatory Visit (INDEPENDENT_AMBULATORY_CARE_PROVIDER_SITE_OTHER): Payer: Medicare Other

## 2017-05-16 ENCOUNTER — Encounter (INDEPENDENT_AMBULATORY_CARE_PROVIDER_SITE_OTHER): Payer: Self-pay | Admitting: Orthopaedic Surgery

## 2017-05-16 DIAGNOSIS — S52322D Displaced transverse fracture of shaft of left radius, subsequent encounter for closed fracture with routine healing: Secondary | ICD-10-CM | POA: Diagnosis not present

## 2017-05-16 DIAGNOSIS — M79602 Pain in left arm: Secondary | ICD-10-CM

## 2017-05-16 MED ORDER — HYDROCODONE-ACETAMINOPHEN 5-325 MG PO TABS: 1.0000 | ORAL_TABLET | Freq: Every evening | ORAL | 0 refills | Status: AC | PRN

## 2017-05-16 NOTE — Progress Notes (Signed)
Mr. Garrett Patterson is almost 3 weeks status post ORIF left radial shaft fracture. He is overall doing well just some mild pain. His incision is healed. The sutures were removed. He has full pronation and supination as well as good wrist and elbow range of motion. X-ray show stable fixation alignment. Norco was prescribed to take at night. He may platform weight-bear. Follow-up in 4 weeks with 2 view x-rays of the left forearm.

## 2017-06-06 ENCOUNTER — Ambulatory Visit (INDEPENDENT_AMBULATORY_CARE_PROVIDER_SITE_OTHER): Payer: Medicare Other | Admitting: Orthopaedic Surgery

## 2017-06-20 ENCOUNTER — Encounter (INDEPENDENT_AMBULATORY_CARE_PROVIDER_SITE_OTHER): Payer: Self-pay | Admitting: Orthopaedic Surgery

## 2017-06-20 ENCOUNTER — Ambulatory Visit (INDEPENDENT_AMBULATORY_CARE_PROVIDER_SITE_OTHER): Payer: Medicare Other

## 2017-06-20 ENCOUNTER — Ambulatory Visit (INDEPENDENT_AMBULATORY_CARE_PROVIDER_SITE_OTHER): Payer: Medicare Other | Admitting: Orthopaedic Surgery

## 2017-06-20 DIAGNOSIS — S52322D Displaced transverse fracture of shaft of left radius, subsequent encounter for closed fracture with routine healing: Secondary | ICD-10-CM

## 2017-06-20 NOTE — Progress Notes (Signed)
Patient is 8 weeks status post ORIF left radial shaft fracture. He is doing well. He has a little bit of stiffness with making a full fist. Otherwise he is not really having any issues. His surgical scars fully healed. He has excellent range of motion. Normal supination and pronation. X-ray show stable fixation and evidence of healing. From my standpoint he may increase activity and weightbearing as tolerated. Questions encouraged and answered. Follow-up as needed.

## 2018-01-14 IMAGING — CR DG FOREARM 2V*L*
2 series · 2 of 2 positions shown · non-contrast
Comparison: None.

CLINICAL DATA: Fall down stairs last night; Painful left forearm
with obvious deformity

EXAM:
LEFT FOREARM - 2 VIEW

[forearm ap]
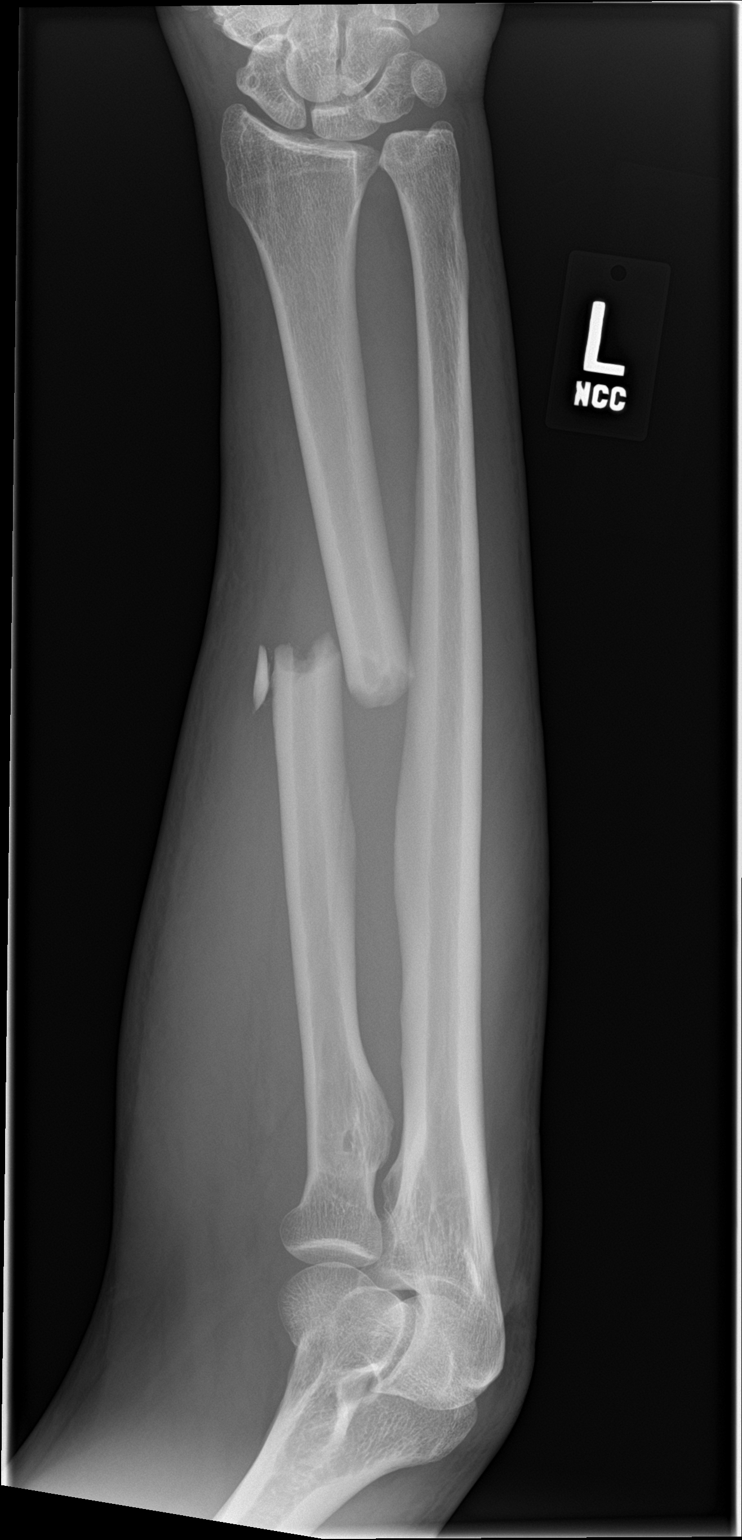

[forearm lat]
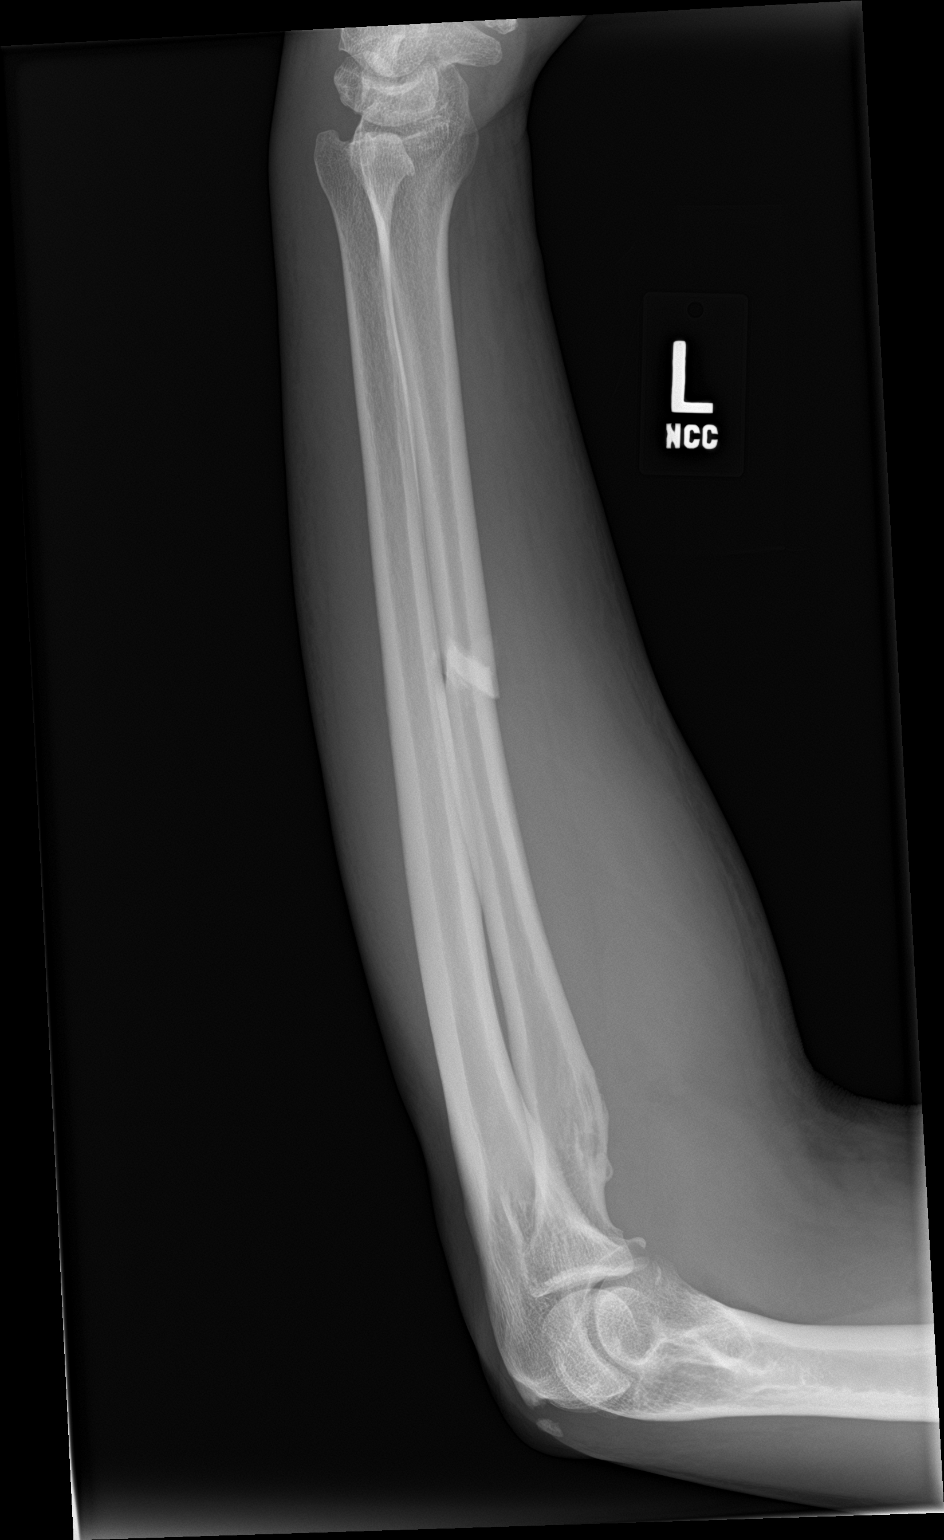

[2 of 2 positions shown; findings below may reference images not displayed]

FINDINGS: Horizontal fracture through the mid shaft radius with 1 shaft width
ulnar displacement of the distal fracture fragment and 1 cm
override. Elbow joint and radiocarpal joint are intact.
IMPRESSION: Horizontal fracture through the midshaft radius with displacement
and override.

## 2018-03-31 ENCOUNTER — Emergency Department (HOSPITAL_COMMUNITY)
Admission: EM | Admit: 2018-03-31 | Discharge: 2018-03-31 | Disposition: A | Discharge status: Home / Self Care | Payer: Medicare Other | Attending: Emergency Medicine | Admitting: Emergency Medicine

## 2018-03-31 ENCOUNTER — Other Ambulatory Visit: Payer: Self-pay

## 2018-03-31 ENCOUNTER — Encounter (HOSPITAL_COMMUNITY): Payer: Self-pay

## 2018-03-31 DIAGNOSIS — Z041 Encounter for examination and observation following transport accident: Secondary | ICD-10-CM | POA: Insufficient documentation

## 2018-03-31 DIAGNOSIS — Z5321 Procedure and treatment not carried out due to patient leaving prior to being seen by health care provider: Secondary | ICD-10-CM | POA: Diagnosis not present

## 2018-03-31 NOTE — ED Notes (Signed)
Pt stated he couldn't wait any longer and would come back tomorrow if he feels worse.
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
## 2018-04-03 NOTE — ED Notes (Signed)
Follow up call made  Pt states feeling some better   0945  04/03/18  s Bridie Colquhoun rn

## 2024-11-21 NOTE — Progress Notes (Signed)
 Garrett Patterson                                          MRN: [id number]   11/21/2024   The [facility] Quality Team Specialist reviewed this patient medical record for the purposes of chart review for care gap closure. The following were reviewed: chart review for care gap closure-care for older adult functional status assessment.    [facility] Quality Team
